# Patient Record
Sex: Female | Born: 2005 | Hispanic: No | Marital: Single | Smoking: Former smoker
Health system: Southern US, Community
[De-identification: ages and names within clinical notes are randomized; demographics above are authoritative.]

## PROBLEM LIST (undated history)

## (undated) DIAGNOSIS — D649 Anemia, unspecified: Secondary | ICD-10-CM

## (undated) HISTORY — PX: NO PAST SURGERIES: SHX2092

---
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
## 2023-04-12 ENCOUNTER — Other Ambulatory Visit: Payer: Self-pay

## 2023-04-12 ENCOUNTER — Encounter: Payer: Self-pay | Admitting: Obstetrics and Gynecology

## 2023-04-12 ENCOUNTER — Observation Stay
Admission: EM | Admit: 2023-04-12 | Discharge: 2023-04-12 | Disposition: A | Discharge status: Home / Self Care | Payer: MEDICAID | Attending: Obstetrics and Gynecology | Admitting: Obstetrics and Gynecology

## 2023-04-12 DIAGNOSIS — O471 False labor at or after 37 completed weeks of gestation: Secondary | ICD-10-CM | POA: Insufficient documentation

## 2023-04-12 DIAGNOSIS — O368130 Decreased fetal movements, third trimester, not applicable or unspecified: Secondary | ICD-10-CM | POA: Diagnosis present

## 2023-04-12 DIAGNOSIS — Z3A37 37 weeks gestation of pregnancy: Secondary | ICD-10-CM | POA: Diagnosis not present

## 2023-04-12 DIAGNOSIS — Z3490 Encounter for supervision of normal pregnancy, unspecified, unspecified trimester: Principal | ICD-10-CM

## 2023-04-12 HISTORY — DX: Anemia, unspecified: D64.9

## 2023-04-12 NOTE — OB Triage Note (Signed)
Discharge instructions discussed and pt recommended to keep her appt at [facility] for next week. Pt advised to follow up with provider for any concerns or red flag symptoms. Pt stable at the time of discharge home with boyfriend and mother.

## 2023-04-12 NOTE — OB Triage Note (Signed)
Pt is a G2P0 and [redacted]w[redacted]d presenting to L&D with c/o decreased fetal movement  since 1000 today. Pt states she has drank a little bit of pink lemonade and ate cereal this morning. Pt denies vaginal bleeding and reports back pain 4/10 on a 0-10 pain scale that feels like "shooting pain." Pt reports occasional braxton hicks. Monitors applied and assessing. MD notified of pts arrival.

## 2023-04-13 NOTE — Discharge Summary (Signed)
   L&D OB Triage Note  SUBJECTIVE Victoria Gallegos is a 17 y.o. G2P0 female at [redacted]w[redacted]d, EDD Estimated Date of Delivery: 04/30/23 who presented to triage with complaints of decreased fetal movement.   OB History  Gravida Para Term Preterm AB Living  2 0 0 0 0 0  SAB IAB Ectopic Multiple Live Births  0 0 0 0 0    # Outcome Date GA Lbr Len/2nd Weight Sex Type Anes PTL Lv  2 Current           1 Gravida             No medications prior to admission.     OBJECTIVE  Nursing Evaluation:   BP 108/74 (BP Location: Right Arm)   Pulse (!) 120   Temp 98.3 F (36.8 C) (Oral)   Resp 18   Ht 5\' 1"  (1.549 m)   Wt 57.6 kg   LMP 04/21/2022 (Exact Date) Comment: neg preg test 04/24/2022  BMI 24.00 kg/m    Findings:        Baby is actively moving by nursing examination and evaluation.      NST was performed and has been reviewed by me.  NST INTERPRETATION: Category I  Mode: External Baseline Rate (A): 150 bpm (fht) Variability: Moderate Accelerations: 15 x 15 Decelerations: None     Contraction Frequency (min): UI with occ ctx  ASSESSMENT Impression:  1.  Pregnancy:  G2P0 at [redacted]w[redacted]d , EDD Estimated Date of Delivery: 04/30/23 2.  Reassuring fetal and maternal status   PLAN 1. Current condition and above findings reviewed.  Reassuring fetal and maternal condition. 2. Discharge home with standard labor precautions given to return to L&D or call the office for problems. 3. Continue routine prenatal care.

## 2024-01-20 ENCOUNTER — Emergency Department (HOSPITAL_COMMUNITY)
Admission: EM | Admit: 2024-01-20 | Discharge: 2024-01-20 | Disposition: A | Discharge status: Home / Self Care | Payer: MEDICAID | Attending: Emergency Medicine | Admitting: Emergency Medicine

## 2024-01-20 ENCOUNTER — Other Ambulatory Visit: Payer: Self-pay

## 2024-01-20 ENCOUNTER — Encounter (HOSPITAL_COMMUNITY): Payer: Self-pay

## 2024-01-20 DIAGNOSIS — Y9301 Activity, walking, marching and hiking: Secondary | ICD-10-CM | POA: Insufficient documentation

## 2024-01-20 DIAGNOSIS — S20419A Abrasion of unspecified back wall of thorax, initial encounter: Secondary | ICD-10-CM | POA: Insufficient documentation

## 2024-01-20 DIAGNOSIS — W540XXA Bitten by dog, initial encounter: Secondary | ICD-10-CM | POA: Insufficient documentation

## 2024-01-20 NOTE — ED Triage Notes (Signed)
 Pt has bite mark to her back after getting bit by a pit bull tonight. Not sure if shots are up to date on dog. Doesn't know when last tetanus shot was.

## 2024-01-20 NOTE — ED Provider Notes (Signed)
 [place] EMERGENCY DEPARTMENT AT [facility] Provider Note   CSN: [id number] Arrival date & time: 01/20/24  2144     History  No chief complaint on file.   Victoria Gallegos is a 18 y.o. female.  Presents to the emergency department with concerns over a dog bite.  Patient reports that she was walking in a dog came out of the yard and bit her on her back.  Animal control has the animal.       Home Medications Prior to Admission medications   Medication Sig Start Date End Date Taking? Authorizing Provider  doxycycline  (VIBRAMYCIN ) 100 MG capsule Take 1 capsule (100 mg total) by mouth 2 (two) times daily. Patient not taking: Reported on 09/11/2022 12/19/21   Adel Aden, PA-C  doxylamine , Sleep, (UNISOM ) 25 MG tablet Take 1 tablet (25 mg total) by mouth at bedtime. Patient not taking: Reported on 04/12/2023 09/11/22   Coretta Dexter, PA  Ferrous Sulfate (IRON PO) Take by mouth. Patient not taking: Reported on 09/11/2022 11/05/17   [provider]  metroNIDAZOLE  (FLAGYL ) 500 MG tablet Take 1 tablet (500 mg total) by mouth 2 (two) times daily. Patient not taking: Reported on 09/11/2022 12/19/21   Adel Aden, PA-C  nitrofurantoin , macrocrystal-monohydrate, (MACROBID ) 100 MG capsule Take 1 capsule (100 mg total) by mouth 2 (two) times daily. Patient not taking: Reported on 04/12/2023 09/11/22   Coretta Dexter, PA  Prenatal Vit-Fe Fumarate-FA (PRENATAL COMPLETE) 14-0.4 MG TABS Take 1 tablet by mouth daily. 11/05/17   Idol, Julie, PA-C  promethazine  (PHENERGAN ) 25 MG tablet Take 1 tablet (25 mg total) by mouth every 6 (six) hours as needed for nausea or vomiting. Patient not taking: Reported on 04/12/2023 09/11/22   Coretta Dexter, PA  pyridOXINE (VITAMIN B6) 25 MG tablet Take 1 tablet (25 mg total) by mouth every 6 (six) hours as needed (nausea). Patient not taking: Reported on 04/12/2023 09/11/22   Coretta Dexter, PA      Allergies     Metoclopramide     Review of Systems   Review of Systems  Physical Exam Updated Vital Signs BP 117/76   Pulse 78   Temp 98.6 F (37 C) (Oral)   Resp 18   SpO2 99%  Physical Exam Vitals and nursing note reviewed.  Constitutional:      Appearance: Normal appearance.  HENT:     Head: Normocephalic and atraumatic.  Eyes:     Pupils: Pupils are equal, round, and reactive to light.  Cardiovascular:     Rate and Rhythm: Normal rate.  Pulmonary:     Effort: Pulmonary effort is normal.  Skin:    Comments: Linear superficial abrasion across mid/upper back  Neurological:     Mental Status: She is alert.     ED Results / Procedures / Treatments   Labs (all labs ordered are listed, but only abnormal results are displayed) Labs Reviewed - No data to display  EKG None  Radiology No results found.  Procedures Procedures    Medications Ordered in ED Medications - No data to display  ED Course/ Medical Decision Making/ A&P                                 Medical Decision Making  Patient reveals a single linear abrasion that does not require any repair.  Wound was cleaned with chlorhexidine, peroxide, saline and then dressed.  Does not require empiric antibiotics, wound is not deep.  Animal is in control of out-of-control, does not require rabies [employer].  Patient's tetanus is up-to-date.        Final Clinical Impression(s) / ED Diagnoses Final diagnoses:  Dog bite, initial encounter    Rx / DC Orders ED Discharge Orders     None         Latitia Housewright, Marine Sia, MD 01/20/24 2317

## 2024-03-10 ENCOUNTER — Emergency Department (HOSPITAL_COMMUNITY): Payer: MEDICAID

## 2024-03-10 ENCOUNTER — Emergency Department (HOSPITAL_COMMUNITY)
Admission: EM | Admit: 2024-03-10 | Discharge: 2024-03-11 | Disposition: A | Discharge status: Home / Self Care | Payer: MEDICAID | Attending: Emergency Medicine | Admitting: Emergency Medicine

## 2024-03-10 ENCOUNTER — Encounter (HOSPITAL_COMMUNITY): Payer: Self-pay

## 2024-03-10 ENCOUNTER — Other Ambulatory Visit: Payer: Self-pay

## 2024-03-10 DIAGNOSIS — R109 Unspecified abdominal pain: Secondary | ICD-10-CM | POA: Diagnosis present

## 2024-03-10 DIAGNOSIS — N390 Urinary tract infection, site not specified: Secondary | ICD-10-CM | POA: Insufficient documentation

## 2024-03-10 DIAGNOSIS — R319 Hematuria, unspecified: Secondary | ICD-10-CM | POA: Insufficient documentation

## 2024-03-10 DIAGNOSIS — D72829 Elevated white blood cell count, unspecified: Secondary | ICD-10-CM | POA: Insufficient documentation

## 2024-03-10 LAB — URINALYSIS, ROUTINE W REFLEX MICROSCOPIC
Bilirubin Urine: NEGATIVE
Glucose, UA: 50 mg/dL — AB
Ketones, ur: 80 mg/dL — AB
Nitrite: NEGATIVE
Protein, ur: 100 mg/dL — AB
Specific Gravity, Urine: 1.024 (ref 1.005–1.030)
WBC, UA: >50 WBC/hpf (ref 0–5)
pH: 5 (ref 5.0–8.0)

## 2024-03-10 LAB — CBC
HCT: 37.6 % (ref 36.0–46.0)
Hemoglobin: 12.9 g/dL (ref 12.0–15.0)
MCH: 31.6 pg (ref 26.0–34.0)
MCHC: 34.3 g/dL (ref 30.0–36.0)
MCV: 92.2 fL (ref 80.0–100.0)
Platelets: 233 10*3/uL (ref 150–400)
RBC: 4.08 MIL/uL (ref 3.87–5.11)
RDW: 12.1 % (ref 11.5–15.5)
WBC: 23.2 10*3/uL — ABNORMAL HIGH (ref 4.0–10.5)
nRBC: 0 % (ref 0.0–0.2)

## 2024-03-10 LAB — COMPREHENSIVE METABOLIC PANEL WITH GFR
ALT: 17 U/L (ref 0–44)
AST: 25 U/L (ref 15–41)
Albumin: 4.2 g/dL (ref 3.5–5.0)
Alkaline Phosphatase: 60 U/L (ref 38–126)
Anion gap: 14 (ref 5–15)
BUN: 14 mg/dL (ref 6–20)
CO2: 16 mmol/L — ABNORMAL LOW (ref 22–32)
Calcium: 8.8 mg/dL — ABNORMAL LOW (ref 8.9–10.3)
Chloride: 108 mmol/L (ref 98–111)
Creatinine, Ser: 0.64 mg/dL (ref 0.44–1.00)
GFR, Estimated: >60 mL/min (ref >60)
Glucose, Bld: 109 mg/dL — ABNORMAL HIGH (ref 70–99)
Potassium: 4.4 mmol/L (ref 3.5–5.1)
Sodium: 138 mmol/L (ref 135–145)
Total Bilirubin: 0.7 mg/dL (ref 0.0–1.2)
Total Protein: 7.7 g/dL (ref 6.5–8.1)

## 2024-03-10 LAB — LIPASE, BLOOD: Lipase: 21 U/L (ref 11–51)

## 2024-03-10 LAB — POC URINE PREG, ED: Preg Test, Ur: NEGATIVE

## 2024-03-10 MED ORDER — CEPHALEXIN 500 MG PO CAPS
500.0000 mg | ORAL_CAPSULE | Freq: Two times a day (BID) | ORAL | 0 refills | Status: AC
Start: 2024-03-10 — End: 2024-03-17

## 2024-03-10 MED ORDER — ONDANSETRON HCL 4 MG/2ML IJ SOLN
4.0000 mg | Freq: Once | INTRAMUSCULAR | Status: AC
Start: 2024-03-10 — End: 2024-03-10
  Administered 2024-03-10: 4 mg via INTRAVENOUS
  Filled 2024-03-10: qty 2

## 2024-03-10 MED ORDER — SODIUM CHLORIDE 0.9 % IV BOLUS
1000.0000 mL | Freq: Once | INTRAVENOUS | Status: AC
  Administered 2024-03-10: 1000 mL via INTRAVENOUS

## 2024-03-10 MED ORDER — PROMETHAZINE HCL 25 MG PO TABS
25.0000 mg | ORAL_TABLET | Freq: Four times a day (QID) | ORAL | 0 refills | Status: AC | PRN
Start: 2024-03-10 — End: ?

## 2024-03-10 MED ORDER — SODIUM CHLORIDE 0.9 % IV SOLN
12.5000 mg | Freq: Once | INTRAVENOUS | Status: AC
  Administered 2024-03-10: 12.5 mg via INTRAVENOUS
  Filled 2024-03-10: qty 0.5

## 2024-03-10 MED ORDER — MORPHINE SULFATE (PF) 2 MG/ML IV SOLN
2.0000 mg | Freq: Once | INTRAVENOUS | Status: AC
  Administered 2024-03-10: 2 mg via INTRAVENOUS
  Filled 2024-03-10: qty 1

## 2024-03-10 MED ORDER — CEPHALEXIN 500 MG PO CAPS
500.0000 mg | ORAL_CAPSULE | Freq: Once | ORAL | Status: AC
Start: 2024-03-10 — End: 2024-03-10
  Administered 2024-03-10: 500 mg via ORAL
  Filled 2024-03-10: qty 1

## 2024-03-10 NOTE — ED Notes (Signed)
 Pt was able to keep water down. Stated nausea is better

## 2024-03-10 NOTE — ED Triage Notes (Signed)
 Pt arrived via POV c/o right flank pain and emesis since yesterday. Pt denies hematuria.

## 2024-03-10 NOTE — ED Notes (Signed)
Pt provided water for fluid challenge 

## 2024-03-10 NOTE — Discharge Instructions (Addendum)
 Take the entire course of the antibiotics prescribed.  Rest and make sure you are drinking plenty of fluids.  I have also prescribed you Phenergan  to help you with your nausea if this returns.  Get rechecked if your symptoms are not improving or if your symptoms worsen I do recommend an immediate recheck, especially if you develop fever, worse pain or if your vomiting becomes uncontrolled.  If your symptoms do resolve I recommend a lab visit with your primary MD for repeat urinalysis to make sure your infection is resolved after the antibiotic has been completed.

## 2024-03-10 NOTE — ED Notes (Signed)
 Pt refusing labs while waiting, states she would like to wait for IV placement

## 2024-03-11 NOTE — ED Notes (Signed)
 Pt stated she is nauseous again. EDP notified

## 2024-03-12 LAB — URINE CULTURE: Culture: <10000 — AB

## 2024-03-12 NOTE — ED Provider Notes (Signed)
 [place] EMERGENCY DEPARTMENT AT [facility] Provider Note   CSN: [id number] Arrival date & time: 03/10/24  1421     Patient presents with: Emesis   Victoria Gallegos is a 18 y.o. female with no significant past medical history presenting for evaluation of nausea, vomiting and right flank pain since yesterday.  She denies history of kidney stones, she also denies fevers or chills, dysuria, hematuria, urgency or frequency.  She has had no diarrhea or constipation, abdominal distention, also denies chest pain or shortness of breath.  She has had no medications nor she found any alleviators for her symptoms.   The history is provided by the patient.       Prior to Admission medications   Medication Sig Start Date End Date Taking? Authorizing Provider  cephALEXin  (KEFLEX ) 500 MG capsule Take 1 capsule (500 mg total) by mouth 2 (two) times daily for 7 days. 03/10/24 03/17/24 Yes Boaz Berisha, PA-C  promethazine  (PHENERGAN ) 25 MG tablet Take 1 tablet (25 mg total) by mouth every 6 (six) hours as needed for nausea or vomiting. 03/10/24  Yes Samanyu Tinnell, Mliss, PA-C  doxycycline  (VIBRAMYCIN ) 100 MG capsule Take 1 capsule (100 mg total) by mouth 2 (two) times daily. Patient not taking: Reported on 09/11/2022 12/19/21   Ruthell Lonni FALCON, PA-C  doxylamine , Sleep, (UNISOM ) 25 MG tablet Take 1 tablet (25 mg total) by mouth at bedtime. Patient not taking: Reported on 04/12/2023 09/11/22   Neldon Hamp RAMAN, PA  Ferrous Sulfate (IRON PO) Take by mouth. Patient not taking: Reported on 09/11/2022 11/05/17   [provider]  metroNIDAZOLE  (FLAGYL ) 500 MG tablet Take 1 tablet (500 mg total) by mouth 2 (two) times daily. Patient not taking: Reported on 09/11/2022 12/19/21   Ruthell Lonni FALCON, PA-C  nitrofurantoin , macrocrystal-monohydrate, (MACROBID ) 100 MG capsule Take 1 capsule (100 mg total) by mouth 2 (two) times daily. Patient not taking: Reported on 04/12/2023 09/11/22   Neldon Hamp RAMAN,  PA  Prenatal Vit-Fe Fumarate-FA (PRENATAL COMPLETE) 14-0.4 MG TABS Take 1 tablet by mouth daily. 11/05/17   Arihana Ambrocio, PA-C  pyridOXINE (VITAMIN B6) 25 MG tablet Take 1 tablet (25 mg total) by mouth every 6 (six) hours as needed (nausea). Patient not taking: Reported on 04/12/2023 09/11/22   Neldon Hamp RAMAN, PA    Allergies: Metoclopramide     Review of Systems  Constitutional:  Negative for chills and fever.  HENT:  Negative for congestion and sore throat.   Eyes: Negative.   Respiratory:  Negative for chest tightness and shortness of breath.   Cardiovascular:  Negative for chest pain.  Gastrointestinal:  Positive for nausea and vomiting. Negative for abdominal pain.  Genitourinary:  Positive for flank pain. Negative for dysuria, frequency, hematuria, pelvic pain and urgency.  Musculoskeletal:  Negative for arthralgias, joint swelling and neck pain.  Skin: Negative.  Negative for rash and wound.  Neurological:  Negative for dizziness, weakness, light-headedness, numbness and headaches.  Psychiatric/Behavioral: Negative.      Updated Vital Signs BP 125/78 (BP Location: Left Arm)   Pulse 63   Temp 98.2 F (36.8 C) (Oral)   Resp 15   Ht 5' 1 (1.549 m)   Wt 56 kg   LMP  (LMP Unknown)   SpO2 99%   BMI 23.33 kg/m   Physical Exam Vitals and nursing note reviewed.  Constitutional:      Appearance: She is well-developed.  HENT:     Head: Normocephalic and atraumatic.   Eyes:  Conjunctiva/sclera: Conjunctivae normal.    Cardiovascular:     Rate and Rhythm: Normal rate and regular rhythm.     Heart sounds: Normal heart sounds.  Pulmonary:     Effort: Pulmonary effort is normal.     Breath sounds: Normal breath sounds. No wheezing.  Abdominal:     General: Bowel sounds are normal.     Palpations: Abdomen is soft.     Tenderness: There is abdominal tenderness. Negative signs include Murphy's sign and McBurney's sign.     Comments: Tender to palpation at the right  lateral abdomen/flank area near the mid axillary line.  There is no guarding, she has no CVA tenderness.  She has no lower extremity abdominal pain, no distention, no increased tympany, normal active bowel sounds.   Musculoskeletal:        General: Normal range of motion.     Cervical back: Normal range of motion.   Skin:    General: Skin is warm and dry.   Neurological:     Mental Status: She is alert.     (all labs ordered are listed, but only abnormal results are displayed) Labs Reviewed  URINE CULTURE - Abnormal; Notable for the following components:      Result Value   Culture   (*)    Value: <10,000 COLONIES/mL INSIGNIFICANT GROWTH Performed at [facility], [address] [place], [place] [address]    All other components within normal limits  COMPREHENSIVE METABOLIC PANEL WITH GFR - Abnormal; Notable for the following components:   CO2 16 (*)    Glucose, Bld 109 (*)    Calcium 8.8 (*)    All other components within normal limits  CBC - Abnormal; Notable for the following components:   WBC 23.2 (*)    All other components within normal limits  URINALYSIS, ROUTINE W REFLEX MICROSCOPIC - Abnormal; Notable for the following components:   APPearance HAZY (*)    Glucose, UA 50 (*)    Hgb urine dipstick SMALL (*)    Ketones, ur 80 (*)    Protein, ur 100 (*)    Leukocytes,Ua TRACE (*)    Bacteria, UA RARE (*)    All other components within normal limits  LIPASE, BLOOD  POC URINE PREG, ED    EKG: None  Radiology: No results found. Results for orders placed or performed during the hospital encounter of 03/10/24  Urine Culture   Collection Time: 03/10/24  6:45 PM   Specimen: Urine, Clean Catch  Result Value Ref Range   Specimen Description      URINE, CLEAN CATCH Performed at [facility], [address] [place], [place] [address]    Special Requests      NONE Performed at [facility], [address] [place], [place] [address]    Culture (A)      <10,000 COLONIES/mL INSIGNIFICANT GROWTH Performed at [facility], [address] [place], [place] [address]    Report Status 03/12/2024 FINAL   Urinalysis, Routine w reflex microscopic -Urine, Clean Catch   Collection Time: 03/10/24  6:45 PM  Result Value Ref Range   Color, Urine YELLOW YELLOW   APPearance HAZY (A) CLEAR   Specific Gravity, Urine 1.024 1.005 - 1.030   pH 5.0 5.0 - 8.0   Glucose, UA 50 (A) NEGATIVE mg/dL   Hgb urine dipstick SMALL (A) NEGATIVE   Bilirubin Urine NEGATIVE NEGATIVE   Ketones, ur 80 (A) NEGATIVE mg/dL   Protein, ur  100 (A) NEGATIVE mg/dL   Nitrite NEGATIVE NEGATIVE   Leukocytes,Ua TRACE (A) NEGATIVE   RBC / HPF 11-20 0 - 5 RBC/hpf   WBC, UA >50 0 - 5 WBC/hpf   Bacteria, UA RARE (A) NONE SEEN   Squamous Epithelial / HPF 0-5 0 - 5 /HPF   Mucus PRESENT   POC urine preg, ED   Collection Time: 03/10/24  6:55 PM  Result Value Ref Range   Preg Test, Ur Negative Negative  Lipase, blood   Collection Time: 03/10/24  9:28 PM  Result Value Ref Range   Lipase 21 11 - 51 U/L  Comprehensive metabolic panel   Collection Time: 03/10/24  9:28 PM  Result Value Ref Range   Sodium 138 135 - 145 mmol/L   Potassium 4.4 3.5 - 5.1 mmol/L   Chloride 108 98 - 111 mmol/L   CO2 16 (L) 22 - 32 mmol/L   Glucose, Bld 109 (H) 70 - 99 mg/dL   BUN 14 6 - 20 mg/dL   Creatinine, Ser 9.35 0.44 - 1.00 mg/dL   Calcium 8.8 (L) 8.9 - 10.3 mg/dL   Total Protein 7.7 6.5 - 8.1 g/dL   Albumin 4.2 3.5 - 5.0 g/dL   AST 25 15 - 41 U/L   ALT 17 0 - 44 U/L   Alkaline Phosphatase 60 38 - 126 U/L   Total Bilirubin 0.7 0.0 - 1.2 mg/dL   GFR, Estimated >39 >39 mL/min   Anion gap 14 5 - 15  CBC   Collection Time: 03/10/24  9:28 PM  Result Value Ref Range   WBC 23.2 (H) 4.0 - 10.5 K/uL   RBC 4.08 3.87 - 5.11 MIL/uL   Hemoglobin 12.9 12.0 - 15.0 g/dL   HCT 62.3 63.9 - 53.9 %   MCV 92.2 80.0 - 100.0 fL   MCH 31.6 26.0 - 34.0 pg   MCHC 34.3 30.0 - 36.0 g/dL   RDW 87.8 88.4 -  84.4 %   Platelets 233 150 - 400 K/uL   nRBC 0.0 0.0 - 0.2 %   CT Renal Stone Study Result Date: 03/10/2024 CLINICAL DATA:  Acute right flank pain, vomiting. EXAM: CT ABDOMEN AND PELVIS WITHOUT CONTRAST TECHNIQUE: Multidetector CT imaging of the abdomen and pelvis was performed following the standard protocol without IV contrast. RADIATION DOSE REDUCTION: This exam was performed according to the departmental dose-optimization program which includes automated exposure control, adjustment of the mA and/or kV according to patient size and/or use of iterative reconstruction technique. COMPARISON:  April 24, 2022. FINDINGS: Lower chest: No acute abnormality. Hepatobiliary: No focal liver abnormality is seen. No gallstones, gallbladder wall thickening, or biliary dilatation. Pancreas: Unremarkable. No pancreatic ductal dilatation or surrounding inflammatory changes. Spleen: Normal in size without focal abnormality. Adrenals/Urinary Tract: Adrenal glands are unremarkable. Kidneys are normal, without renal calculi, focal lesion, or hydronephrosis. Bladder is unremarkable. Stomach/Bowel: Stomach is within normal limits. Appendix appears normal. No evidence of bowel wall thickening, distention, or inflammatory changes. Vascular/Lymphatic: No significant vascular findings are present. No enlarged abdominal or pelvic lymph nodes. Reproductive: Uterus and bilateral adnexa are unremarkable. Other: No abdominal wall hernia or abnormality. No abdominopelvic ascites. Musculoskeletal: No acute or significant osseous findings. IMPRESSION: No definite abnormality seen in the abdomen or pelvis. Electronically Signed   By: Lynwood Landy Raddle M.D.   On: 03/10/2024 20:11     Procedures   Medications Ordered in the ED  sodium chloride  0.9 % bolus 1,000 mL (0 mLs Intravenous Stopped  03/10/24 2101)  ondansetron  (ZOFRAN ) injection 4 mg (4 mg Intravenous Given 03/10/24 1918)  morphine  (PF) 2 MG/ML injection 2 mg (2 mg Intravenous Given  03/10/24 1918)  sodium chloride  0.9 % bolus 1,000 mL (0 mLs Intravenous Stopped 03/10/24 2345)  promethazine  (PHENERGAN ) 12.5 mg in sodium chloride  0.9 % 50 mL IVPB (0 mg Intravenous Stopped 03/10/24 2224)  cephALEXin  (KEFLEX ) capsule 500 mg (500 mg Oral Given 03/10/24 2348)                                    Medical Decision Making Patient presented with nausea and vomiting and right flank/right lower lateral abdominal pain since yesterday.  Differential diagnosis including kidney stones, pyelonephritis, UTI, appendicitis, acute cholecystitis, bowel obstruction.  Her exam is reassuring with out rebound or guarding, no acute abdominal findings, negative Murphy sign, doubt gallbladder or bowel source of symptoms.  After CT imaging and labs, it appears patient may have a UTI, she is placed on Keflex  and also given Phenergan .  She was also given IV fluids here along with Phenergan  after Zofran  was not effective for her nausea.  She was much improved at time of discharge.  Advise close follow-up with her primary provider if her symptoms persist or worsen.  Amount and/or Complexity of Data Reviewed Labs: ordered.    Details: Labs were reviewed, abnormalities including glucose of 109, her CO2 was reduced at 16 her anion gap is normal at 14, negative lipase, urine does show small amount of hemoglobin, 80 of ketones 100 protein greater than 50 leukocytes with bacteria. Radiology: ordered.    Details: CT renal study is negative for kidney stones or suggestion of pyelonephritis.  No acute abdominal findings noted.  Risk Prescription drug management.        Final diagnoses:  Urinary tract infection with hematuria, site unspecified    ED Discharge Orders          Ordered    cephALEXin  (KEFLEX ) 500 MG capsule  2 times daily        03/10/24 2338    promethazine  (PHENERGAN ) 25 MG tablet  Every 6 hours PRN        03/10/24 2338               Pellegrino Kennard, PA-C 03/12/24 2216    Francesca Elsie CROME, MD 03/13/24 [phone]
